# Patient Record
Sex: Male | Born: 1985 | Race: Black or African American | Hispanic: No | Marital: Married | Smoking: Current every day smoker
Health system: Southern US, Community
[De-identification: ages and names within clinical notes are randomized; demographics above are authoritative.]

---

## 2013-10-03 ENCOUNTER — Emergency Department (HOSPITAL_COMMUNITY)
Admission: EM | Admit: 2013-10-03 | Discharge: 2013-10-03 | Disposition: A | Discharge status: Home / Self Care | Payer: Self-pay | Attending: Emergency Medicine | Admitting: Emergency Medicine

## 2013-10-03 ENCOUNTER — Encounter (HOSPITAL_COMMUNITY): Payer: Self-pay | Admitting: Emergency Medicine

## 2013-10-03 DIAGNOSIS — F17200 Nicotine dependence, unspecified, uncomplicated: Secondary | ICD-10-CM | POA: Insufficient documentation

## 2013-10-03 DIAGNOSIS — J020 Streptococcal pharyngitis: Secondary | ICD-10-CM | POA: Insufficient documentation

## 2013-10-03 DIAGNOSIS — R000 Tachycardia, unspecified: Secondary | ICD-10-CM | POA: Insufficient documentation

## 2013-10-03 MED ORDER — TRAMADOL HCL 50 MG PO TABS: 50.0000 mg | ORAL_TABLET | Freq: Four times a day (QID) | ORAL | Status: DC | PRN

## 2013-10-03 MED ORDER — AMOXICILLIN 500 MG PO CAPS: 500.0000 mg | ORAL_CAPSULE | Freq: Three times a day (TID) | ORAL | Status: DC

## 2013-10-03 NOTE — ED Provider Notes (Signed)
CSN: [id number]     Arrival date & time 10/03/13  1132 History  This chart was scribed for non-physician practitioner, Emilia BeckKaitlyn Szekalski, PA-C, working with Ethelda ChickMartha K Linker, MD, by Bronson CurbJacqueline Melvin, ED Scribe. This patient was seen in room TR11C/TR11C and the patient's care was started at 12:25 PM.    Chief Complaint  Patient presents with  . Sore Throat    Patient is a 28 y.o. male presenting with pharyngitis. The history is provided by the patient. No language interpreter was used.  Sore Throat This is a new problem. The current episode started 2 days ago. The problem has not changed since onset.Pertinent negatives include no chest pain, no abdominal pain, no headaches and no shortness of breath. Nothing aggravates the symptoms. Nothing relieves the symptoms. He has tried nothing for the symptoms.    HPI Comments: Aaron Mcbride is a 28 y.o. male with no history of significant health conditions, who presents to the Emergency Department complaining of sore throat onset 2 day ago. Patient reports pain when swallowing and believes his "glands are swollen" There is associated cough, low grade fever (traige temp 99.3 F), decreased appetite, myalgias, and malaise. He has not taken anything to relieve his symptoms and denies any modifying factors. Patient reports history of similar symptoms.    History reviewed. No pertinent past medical history. History reviewed. No pertinent past surgical history. No family history on file. History  Substance Use Topics  . Smoking status: Current Every Day Smoker  . Smokeless tobacco: Not on file  . Alcohol Use: Yes    Review of Systems  Constitutional: Positive for activity change and fatigue.  HENT: Positive for sore throat.   Respiratory: Positive for cough. Negative for shortness of breath.   Cardiovascular: Negative for chest pain.  Gastrointestinal: Negative for abdominal pain.  Musculoskeletal: Positive for myalgias.  Neurological: Negative for  headaches.  All other systems reviewed and are negative.     Allergies  Review of patient's allergies indicates not on file.  Home Medications   Prior to Admission medications   Not on File   Triage Vitals: BP 125/72  Pulse 108  Temp(Src) 99.3 F (37.4 C) (Oral)  Resp 17  Ht 5\' 7"  (1.702 m)  Wt 225 lb (102.059 kg)  BMI 35.23 kg/m2  SpO2 96%  Physical Exam  Nursing note and vitals reviewed. Constitutional: He is oriented to person, place, and time. He appears well-developed and well-nourished. No distress.  HENT:  Head: Normocephalic and atraumatic.  Mouth/Throat: Oropharyngeal exudate present.  Bilateral tonsillar edema, erythema and exudate.   Eyes: Conjunctivae and EOM are normal.  Neck: Neck supple. No tracheal deviation present.  Cardiovascular: Normal rate.   Pulmonary/Chest: Effort normal. No respiratory distress.  Musculoskeletal: Normal range of motion.  Lymphadenopathy:    He has cervical adenopathy.  Neurological: He is alert and oriented to person, place, and time.  Skin: Skin is warm and dry.  Psychiatric: He has a normal mood and affect. His behavior is normal.    ED Course  Procedures (including critical care time)  DIAGNOSTIC STUDIES: Oxygen Saturation is 96% on room air, adequate by my interpretation.    COORDINATION OF CARE: At 1232 Discussed treatment plan with patient which includes Amoxil and Ultram. Patient agrees.   Labs Review Labs Reviewed - No data to display  Imaging Review No results found.   EKG Interpretation None      MDM   Final diagnoses:  Strep throat    12:57  PM Patient will be treated for strep throat based on [facility] criteria. Patient has a low grade temp and mildly tachycardic. Patient will have amoxicillin and tramadol for symptoms. Patient instructed to return with worsening or concerning symptoms.   I personally performed the services described in this documentation, which was scribed in my presence. The  recorded information has been reviewed and is accurate.    Emilia BeckKaitlyn Szekalski, PA-C 10/03/13 1258

## 2013-10-03 NOTE — ED Notes (Signed)
Pt. Stated, My glands are swollen for 2 days.

## 2013-10-03 NOTE — ED Provider Notes (Signed)
Medical screening examination/treatment/procedure(s) were performed by non-physician practitioner and as supervising physician I was immediately available for consultation/collaboration.   EKG Interpretation None       [name], MD 10/03/13 1501 

## 2013-10-03 NOTE — Discharge Instructions (Signed)
Take Amoxicillin as directed until gone. Take Tramadol as needed for pain. Refer to attache documents for more information. Return to the ED with worsening or concerning symptoms.

## 2013-10-16 ENCOUNTER — Emergency Department (HOSPITAL_COMMUNITY)
Admission: EM | Admit: 2013-10-16 | Discharge: 2013-10-16 | Disposition: A | Discharge status: Home / Self Care | Payer: Self-pay | Attending: Emergency Medicine | Admitting: Emergency Medicine

## 2013-10-16 ENCOUNTER — Encounter (HOSPITAL_COMMUNITY): Payer: Self-pay | Admitting: Emergency Medicine

## 2013-10-16 DIAGNOSIS — H9209 Otalgia, unspecified ear: Secondary | ICD-10-CM | POA: Insufficient documentation

## 2013-10-16 DIAGNOSIS — H9202 Otalgia, left ear: Secondary | ICD-10-CM

## 2013-10-16 DIAGNOSIS — J029 Acute pharyngitis, unspecified: Secondary | ICD-10-CM | POA: Insufficient documentation

## 2013-10-16 DIAGNOSIS — F17200 Nicotine dependence, unspecified, uncomplicated: Secondary | ICD-10-CM | POA: Insufficient documentation

## 2013-10-16 MED ORDER — AMOXICILLIN-POT CLAVULANATE 250-62.5 MG/5ML PO SUSR: 500.0000 mg | Freq: Two times a day (BID) | ORAL | Status: AC

## 2013-10-16 MED ORDER — IBUPROFEN 800 MG PO TABS: 800.0000 mg | ORAL_TABLET | Freq: Three times a day (TID) | ORAL | Status: AC

## 2013-10-16 NOTE — ED Notes (Signed)
Pt was treated 2 weeks ago for strep throat . Pt reports he finished antibx. And pain meds. Pt reports no improvement after completion of  meds.

## 2013-10-16 NOTE — ED Notes (Signed)
He states his L ear aches and "my glands feel swollen on the left side of my neck."

## 2013-10-16 NOTE — ED Provider Notes (Signed)
CSN: [id number]     Arrival date & time 10/16/13  1429 History   First MD Initiated Contact with Patient 10/16/13 1555     Chief Complaint  Patient presents with  . Otalgia     (Consider location/radiation/quality/duration/timing/severity/associated sxs/prior Treatment) HPI Comments: Patient presents to the ED with a chief complaint of sore throat and ear ache.  He states that his symptoms started about 2 weeks ago.  He was treated with amoxicillin and tramadol, but states that his symptoms have not improved.  He reports intermittent fevers and chills.  Denies nausea, vomiting, diarrhea, or constipation.  He also states that his glands have been swollen.  States that his throat pain makes swallowing difficult.  The history is provided by the patient. No language interpreter was used.    History reviewed. No pertinent past medical history. History reviewed. No pertinent past surgical history. History reviewed. No pertinent family history. History  Substance Use Topics  . Smoking status: Current Every Day Smoker    Types: Cigarettes  . Smokeless tobacco: Not on file  . Alcohol Use: Yes    Review of Systems  Constitutional: Positive for fever and chills.  HENT: Positive for ear pain and sore throat. Negative for drooling and postnasal drip.   Respiratory: Negative for cough and shortness of breath.   Gastrointestinal: Negative for nausea, vomiting, diarrhea and constipation.      Allergies  Review of patient's allergies indicates no known allergies.  Home Medications   Prior to Admission medications   Medication Sig Start Date End Date Taking? Authorizing Provider  amoxicillin-clavulanate (AUGMENTIN) 250-62.5 MG/5ML suspension Take 10 mLs (500 mg total) by mouth 2 (two) times daily. 10/16/13   Roxy Horsemanobert Synia Douglass, PA-C  ibuprofen (ADVIL,MOTRIN) 800 MG tablet Take 1 tablet (800 mg total) by mouth 3 (three) times daily. 10/16/13   Roxy Horsemanobert Ori Kreiter, PA-C   BP 137/73  Pulse 106   Temp(Src) 99 F (37.2 C) (Oral)  Resp 20  Ht 5\' 9"  (1.753 m)  Wt 223 lb (101.152 kg)  BMI 32.92 kg/m2  SpO2 97% Physical Exam  Nursing note and vitals reviewed. Constitutional: He is oriented to person, place, and time. He appears well-developed and well-nourished.  HENT:  Head: Normocephalic and atraumatic.  Oropharynx is erythematous, no exudates. No signs of peritonsillar or tonsillar abscess.  No signs of gingival abscess. Uvula is midline.  Airway is intact. No signs of Ludwig's angina with palpation of oral and sublingual mucosa. Tolerating secretions.  Left TM is mildly erythematous with effusion   Eyes: Conjunctivae and EOM are normal.  Neck: Normal range of motion.  Left sided anterior cervical chain adenopathy  Cardiovascular: Normal rate.   Pulmonary/Chest: Effort normal.  Abdominal: He exhibits no distension.  Musculoskeletal: Normal range of motion.  Neurological: He is alert and oriented to person, place, and time.  Skin: Skin is dry.  Psychiatric: He has a normal mood and affect. His behavior is normal. Judgment and thought content normal.    ED Course  Procedures (including critical care time) Labs Review Labs Reviewed - No data to display  Imaging Review No results found.   EKG Interpretation None      MDM   Final diagnoses:  Otalgia, left    Patient with sore throat and earache with effusion.  Was treated with amoxicillin, but no improvement.  Will bump the patient up to augmentin.  Recommend smooth diet for the next couple of days.  Recommend return for worsening symptoms, otherwise follow-up with  PCP.    Ceasar MonsFiled Vitals:   10/16/13 1617  BP: 139/83  Pulse: 92  Temp:   Resp: [address]       Larisa Lanius, PA-C 10/16/13 1647

## 2013-10-16 NOTE — ED Provider Notes (Signed)
Medical screening examination/treatment/procedure(s) were performed by non-physician practitioner and as supervising physician I was immediately available for consultation/collaboration.    Linwood DibblesJon Nyeisha Goodall, MD 10/16/13 [phone]

## 2013-10-16 NOTE — ED Notes (Signed)
Declined W/C at D/C and was escorted to lobby by RN. 

## 2013-10-16 NOTE — Discharge Instructions (Signed)
Otitis Media With Effusion °Otitis media with effusion is the presence of fluid in the middle ear. This is a common problem in children, which often follows ear infections. It may be present for weeks or longer after the infection. Unlike an acute ear infection, otitis media with effusion refers only to fluid behind the ear drum and not infection. Children with repeated ear and sinus infections and allergy problems are the most likely to get otitis media with effusion. °CAUSES  °The most frequent cause of the fluid buildup is dysfunction of the eustachian tubes. These are the tubes that drain fluid in the ears to the to the back of the nose (nasopharynx). °SYMPTOMS  °· The main symptom of this condition is hearing loss. As a result, you or your child may: °¨ Listen to the TV at a loud volume. °¨ Not respond to questions. °¨ Ask "what" often when spoken to. °¨ Mistake or confuse on sound or word for another. °· There may be a sensation of fullness or pressure but usually not pain. °DIAGNOSIS  °· Your health care provider will diagnose this condition by examining you or your child's ears. °· Your health care provider may test the pressure in you or your child's ear with a tympanometer. °· A hearing test may be conducted if the problem persists. °TREATMENT  °· Treatment depends on the duration and the effects of the effusion. °· Antibiotics, decongestants, nose drops, and cortisone-type drugs (tablets or nasal spray) may not be helpful. °· Children with persistent ear effusions may have delayed language or behavioral problems. Children at risk for developmental delays in hearing, learning, and speech may require referral to a specialist earlier than children not at risk. °· You or your child's health care provider may suggest a referral to an ear, nose, and throat surgeon for treatment. The following may help restore normal hearing: °¨ Drainage of fluid. °¨ Placement of ear tubes (tympanostomy tubes). °¨ Removal of  adenoids (adenoidectomy). °HOME CARE INSTRUCTIONS  °· Avoid second hand smoke. °· Infants who are breast fed are less likely to have this condition. °· Avoid feeding infants while laying flat. °· Avoid known environmental allergens. °· Avoid people who are sick. °SEEK MEDICAL CARE IF:  °· Hearing is not better in 3 months. °· Hearing is worse. °· Ear pain. °· Drainage from the ear. °· Dizziness. °MAKE SURE YOU:  °· Understand these instructions. °· Will watch your condition. °· Will get help right away if you are not doing well or get worse. °Document Released: 05/07/2004 Document Revised: 01/18/2013 Document Reviewed: 10/25/2012 °[employer]® Patient Information ©2015 [employer]. This information is not intended to replace advice given to you by your health care provider. Make sure you discuss any questions you have with your health care provider. ° °

## 2017-11-26 ENCOUNTER — Encounter (HOSPITAL_COMMUNITY): Payer: Self-pay | Admitting: Emergency Medicine

## 2017-11-26 ENCOUNTER — Emergency Department (HOSPITAL_COMMUNITY)
Admission: EM | Admit: 2017-11-26 | Discharge: 2017-11-26 | Disposition: A | Discharge status: Home / Self Care | Payer: Self-pay | Attending: Emergency Medicine | Admitting: Emergency Medicine

## 2017-11-26 ENCOUNTER — Other Ambulatory Visit: Payer: Self-pay

## 2017-11-26 ENCOUNTER — Emergency Department (HOSPITAL_COMMUNITY): Payer: Self-pay

## 2017-11-26 DIAGNOSIS — F17210 Nicotine dependence, cigarettes, uncomplicated: Secondary | ICD-10-CM | POA: Insufficient documentation

## 2017-11-26 DIAGNOSIS — E860 Dehydration: Secondary | ICD-10-CM | POA: Insufficient documentation

## 2017-11-26 DIAGNOSIS — R42 Dizziness and giddiness: Secondary | ICD-10-CM | POA: Insufficient documentation

## 2017-11-26 DIAGNOSIS — R531 Weakness: Secondary | ICD-10-CM | POA: Insufficient documentation

## 2017-11-26 DIAGNOSIS — R1084 Generalized abdominal pain: Secondary | ICD-10-CM | POA: Insufficient documentation

## 2017-11-26 LAB — HEPATIC FUNCTION PANEL
ALBUMIN: 3.7 g/dL (ref 3.5–5.0)
ALT: 32 U/L (ref 0–44)
AST: 27 U/L (ref 15–41)
Alkaline Phosphatase: 70 U/L (ref 38–126)
BILIRUBIN TOTAL: 1.1 mg/dL (ref 0.3–1.2)
Bilirubin, Direct: 0.2 mg/dL (ref 0.0–0.2)
Indirect Bilirubin: 0.9 mg/dL (ref 0.3–0.9)
Total Protein: 6.7 g/dL (ref 6.5–8.1)

## 2017-11-26 LAB — URINALYSIS, ROUTINE W REFLEX MICROSCOPIC
Bilirubin Urine: NEGATIVE
GLUCOSE, UA: NEGATIVE mg/dL
Hgb urine dipstick: NEGATIVE
KETONES UR: NEGATIVE mg/dL
LEUKOCYTES UA: NEGATIVE
NITRITE: NEGATIVE
PROTEIN: NEGATIVE mg/dL
Specific Gravity, Urine: >1.046 — ABNORMAL HIGH (ref 1.005–1.030)
pH: 6 (ref 5.0–8.0)

## 2017-11-26 LAB — BASIC METABOLIC PANEL
Anion gap: 9 (ref 5–15)
BUN: 10 mg/dL (ref 6–20)
CALCIUM: 8.7 mg/dL — AB (ref 8.9–10.3)
CO2: 20 mmol/L — ABNORMAL LOW (ref 22–32)
CREATININE: 0.88 mg/dL (ref 0.61–1.24)
Chloride: 108 mmol/L (ref 98–111)
Glucose, Bld: 116 mg/dL — ABNORMAL HIGH (ref 70–99)
Potassium: 3.8 mmol/L (ref 3.5–5.1)
Sodium: 137 mmol/L (ref 135–145)

## 2017-11-26 LAB — RAPID URINE DRUG SCREEN, HOSP PERFORMED
Amphetamines: NOT DETECTED
Barbiturates: NOT DETECTED
Benzodiazepines: NOT DETECTED
COCAINE: NOT DETECTED
OPIATES: NOT DETECTED
Tetrahydrocannabinol: NOT DETECTED

## 2017-11-26 LAB — CBC
HCT: 51.9 % (ref 39.0–52.0)
Hemoglobin: 17.4 g/dL — ABNORMAL HIGH (ref 13.0–17.0)
MCH: 29.9 pg (ref 26.0–34.0)
MCHC: 33.5 g/dL (ref 30.0–36.0)
MCV: 89.2 fL (ref 78.0–100.0)
PLATELETS: 235 10*3/uL (ref 150–400)
RBC: 5.82 MIL/uL — AB (ref 4.22–5.81)
RDW: 12.2 % (ref 11.5–15.5)
WBC: 7.7 10*3/uL (ref 4.0–10.5)

## 2017-11-26 LAB — CBG MONITORING, ED: GLUCOSE-CAPILLARY: 116 mg/dL — AB (ref 70–99)

## 2017-11-26 LAB — LIPASE, BLOOD: Lipase: 22 U/L (ref 11–51)

## 2017-11-26 MED ORDER — SODIUM CHLORIDE 0.9 % IV SOLN
INTRAVENOUS | Status: DC
  Administered 2017-11-26: 09:00:00 via INTRAVENOUS

## 2017-11-26 MED ORDER — IOHEXOL 300 MG/ML  SOLN
100.0000 mL | Freq: Once | INTRAMUSCULAR | Status: AC | PRN
  Administered 2017-11-26: 100 mL via INTRAVENOUS

## 2017-11-26 MED ORDER — ONDANSETRON HCL 4 MG/2ML IJ SOLN
4.0000 mg | Freq: Once | INTRAMUSCULAR | Status: AC
  Administered 2017-11-26: 4 mg via INTRAVENOUS
  Filled 2017-11-26: qty 2

## 2017-11-26 MED ORDER — MECLIZINE HCL 25 MG PO TABS
25.0000 mg | ORAL_TABLET | Freq: Once | ORAL | Status: AC
  Administered 2017-11-26: 25 mg via ORAL
  Filled 2017-11-26: qty 1

## 2017-11-26 MED ORDER — SODIUM CHLORIDE 0.9 % IV BOLUS
1000.0000 mL | Freq: Once | INTRAVENOUS | Status: AC
  Administered 2017-11-26: 1000 mL via INTRAVENOUS

## 2017-11-26 MED ORDER — MECLIZINE HCL 25 MG PO TABS: 25.0000 mg | ORAL_TABLET | Freq: Three times a day (TID) | ORAL | 0 refills | Status: AC | PRN

## 2017-11-26 NOTE — ED Provider Notes (Addendum)
[facility] Provider [facility]   CSN: [id number] Arrival date & time: 11/26/17  0435     History   Chief Complaint Chief Complaint  Patient presents with  . Weakness    HPI Aaron Mcbride is a 32 y.o. male.  Patient works as a Technical brewerhighway min.  Works at night.  Patient presents with the feeling of dizziness some room spinning some nominal pain mostly upper quadrants feeling weak and fatigued.  No history of recent upper respiratory infection.  Some nausea but no vomiting.  No specific neuro focal deficits no significant headache or fever neck pain.  No unusual rash.  Patient thinks that he may be dehydrated.     History reviewed. No pertinent past medical history.  There are no active problems to display for this patient.   History reviewed. No pertinent surgical history.      Home Medications    Prior to Admission medications   Medication Sig Start Date End Date Taking? Authorizing Provider  amoxicillin-clavulanate (AUGMENTIN) 250-62.5 MG/5ML suspension Take 10 mLs (500 mg total) by mouth 2 (two) times daily. Patient not taking: Reported on 11/26/2017 10/16/13   Roxy HorsemanBrowning, Robert, PA-C  ibuprofen (ADVIL,MOTRIN) 800 MG tablet Take 1 tablet (800 mg total) by mouth 3 (three) times daily. Patient not taking: Reported on 11/26/2017 10/16/13   Roxy HorsemanBrowning, Robert, PA-C  meclizine (ANTIVERT) 25 MG tablet Take 1 tablet (25 mg total) by mouth 3 (three) times daily as needed for dizziness. 11/26/17   Vanetta MuldersZackowski, Yarel Kilcrease, MD    Family History No family history on file.  Social History Social History   Tobacco Use  . Smoking status: Current Every Day Smoker    Packs/day: 1.00    Types: Cigarettes  . Smokeless tobacco: Never Used  Substance Use Topics  . Alcohol use: Yes  . Drug use: No     Allergies   Patient has no known allergies.   Review of Systems Review of Systems  Constitutional: Negative for fever.  HENT: Negative for congestion.     Eyes: Positive for photophobia. Negative for visual disturbance.  Respiratory: Negative for shortness of breath.   Cardiovascular: Negative for chest pain.  Gastrointestinal: Positive for abdominal pain and nausea. Negative for vomiting.  Genitourinary: Negative for dysuria.  Musculoskeletal: Negative for back pain.  Skin: Negative for rash.  Neurological: Positive for dizziness and weakness. Negative for seizures, syncope and headaches.  Hematological: Does not bruise/bleed easily.  Psychiatric/Behavioral: Negative for confusion.     Physical Exam Updated Vital Signs BP 117/73   Pulse 70   Temp 98.2 F (36.8 C) (Oral)   Resp 15   SpO2 98%   Physical Exam  Constitutional: He is oriented to person, place, and time. He appears well-developed and well-nourished. No distress.  HENT:  Head: Normocephalic and atraumatic.  Mucous membranes dry.  Eyes: Pupils are equal, round, and reactive to light. Conjunctivae and EOM are normal.  Cardiovascular: Normal rate, regular rhythm and normal heart sounds.  Pulmonary/Chest: Effort normal and breath sounds normal. No respiratory distress.  Abdominal: Soft. Bowel sounds are normal. There is tenderness.  Musculoskeletal: Normal range of motion. He exhibits no tenderness.  Neurological: He is alert and oriented to person, place, and time. No cranial nerve deficit or sensory deficit. He exhibits normal muscle tone. Coordination normal.  Skin: Skin is warm. No rash noted.  Nursing note and vitals reviewed.    ED Treatments / Results  Labs (all labs ordered are listed, but  only abnormal results are displayed) Labs Reviewed  BASIC METABOLIC PANEL - Abnormal; Notable for the following components:      Result Value   CO2 20 (*)    Glucose, Bld 116 (*)    Calcium 8.7 (*)    All other components within normal limits  CBC - Abnormal; Notable for the following components:   RBC 5.82 (*)    Hemoglobin 17.4 (*)    All other components within  normal limits  URINALYSIS, ROUTINE W REFLEX MICROSCOPIC - Abnormal; Notable for the following components:   Specific Gravity, Urine >1.046 (*)    All other components within normal limits  CBG MONITORING, ED - Abnormal; Notable for the following components:   Glucose-Capillary 116 (*)    All other components within normal limits  HEPATIC FUNCTION PANEL  LIPASE, BLOOD  RAPID URINE DRUG SCREEN, HOSP PERFORMED    EKG EKG Interpretation  Date/Time:  Friday November 26 2017 04:52:01 EDT Ventricular Rate:  88 PR Interval:  134 QRS Duration: 94 QT Interval:  384 QTC Calculation: 464 R Axis:   57 Text Interpretation:  Normal sinus rhythm Normal ECG Confirmed by Vanetta Mulders [phone]) on 11/26/2017 7:24:32 AM   Radiology Ct Head Wo Contrast  Result Date: 11/26/2017 CLINICAL DATA:  Dizziness.  Ataxia. EXAM: CT HEAD WITHOUT CONTRAST TECHNIQUE: Contiguous axial images were obtained from the base of the skull through the vertex without intravenous contrast. COMPARISON:  None. FINDINGS: Brain: No mass lesion, hemorrhage, hydrocephalus, acute infarct, intra-axial, or extra-axial fluid collection. Vascular: No hyperdense vessel or unexpected calcification. Skull: No significant soft tissue swelling.  Normal skull. Sinuses/Orbits: Probable lymph node within the right parotid gland at 10 mm on image 1/4. Normal imaged portions of the orbits and globes. Clear paranasal sinuses and mastoid air cells. Other: None. IMPRESSION: No acute intracranial abnormality. Electronically Signed   By: Jeronimo Greaves M.D.   On: 11/26/2017 10:40   Ct Abdomen Pelvis W Contrast  Result Date: 11/26/2017 CLINICAL DATA:  Abdominal pain and weakness EXAM: CT ABDOMEN AND PELVIS WITH CONTRAST TECHNIQUE: Multidetector CT imaging of the abdomen and pelvis was performed using the standard protocol following bolus administration of intravenous contrast. CONTRAST:  OMNIPAQUE IOHEXOL 300 MG/ML  SOLN COMPARISON:  None. FINDINGS:  Lower chest:  No contributory findings. Hepatobiliary: Low-density liver with sparing at the gallbladder fossa. Negative for mass.No evidence of biliary obstruction or stone. Pancreas: Unremarkable. Spleen: Unremarkable. Adrenals/Urinary Tract: Negative adrenals. No hydronephrosis or stone. Early excretion of contrast seen in the Upper collecting system and to a small degree in the urinary bladder. Unremarkable bladder. Stomach/Bowel:  No obstruction. No appendicitis. Vascular/Lymphatic: No acute vascular abnormality. Prominent lymph nodes along the greater curvature of the stomach but the stomach itself is unremarkable. Reproductive:No pathologic findings. Other: No ascites or pneumoperitoneum. Musculoskeletal: No acute abnormalities. IMPRESSION: No acute finding. Hepatic steatosis. Electronically Signed   By: Marnee Spring M.D.   On: 11/26/2017 11:12    Procedures Procedures (including critical care time)  Medications Ordered in ED Medications  0.9 %  sodium chloride infusion ( Intravenous New Bag/Given 11/26/17 0846)  sodium chloride 0.9 % bolus 1,000 mL (0 mLs Intravenous Stopped 11/26/17 1018)  meclizine (ANTIVERT) tablet 25 mg (25 mg Oral Given 11/26/17 0846)  ondansetron (ZOFRAN) injection 4 mg (4 mg Intravenous Given 11/26/17 0846)  iohexol (OMNIPAQUE) 300 MG/ML solution 100 mL (100 mLs Intravenous Contrast Given 11/26/17 1034)     Initial Impression / Assessment and Plan / ED Course  I have reviewed the triage vital signs and the nursing notes.  Pertinent labs & imaging results that were available during my care of the patient were reviewed by me and considered in my medical decision making (see chart for details).    Patient with symptoms of dizziness may be some slight vertigo.  But improved here with meclizine.  Patient also with complaint of upper abdominal pain.  CT abdomen negative.  Lab work-up without any significant abnormalities.  Patient symptoms overall improved.  Patient  works as a Education administratorhighwayman.   we will take him out of work until Monday.  Referral to neurology if dizziness persists.  He will return for any new or worse symptoms.  Urinalysis was very concentrated.  Symptoms could be secondary to dehydration.  Urine drug screen still pending.   Final Clinical Impressions(s) / ED Diagnoses   Final diagnoses:  Weakness  Dizziness  Generalized abdominal pain  Dehydration    ED Discharge Orders         Ordered    meclizine (ANTIVERT) 25 MG tablet  3 times daily PRN     11/26/17 1230           Vanetta MuldersZackowski, Karista Aispuro, MD 11/26/17 1246   Urine drug screen negative.   Vanetta MuldersZackowski, Jonathyn Carothers, MD 11/26/17 1256

## 2017-11-26 NOTE — ED Notes (Signed)
Pt still not able to give urine sample, explained to patient importance of urine sample; pt states " well let me see if I can try in a while "

## 2017-11-26 NOTE — ED Notes (Addendum)
Pt made aware that we need a urine sample but unable to go at this time.

## 2017-11-26 NOTE — ED Triage Notes (Signed)
Pt states that since yesterday has had a feeling of weakness and felt dizzy. Got to work and had to leave early. Nausea but no vomiting. No appetite. Pt states he feels unsteady on his feet. No LOC.

## 2017-11-26 NOTE — ED Notes (Signed)
Pt was able to ambulate to bathroom with strong and steady gait ; pt states " I feel a lot better at this time " ; pt eating at this time

## 2017-11-26 NOTE — Discharge Instructions (Addendum)
Make an appointment to follow-up with neurology.  Take the meclizine/Antivert as needed for the dizziness.  Work note provided.  At return for any new or worse symptoms.  If dizziness persist follow-up with neurology.  Part of the issue seems to be dehydration.  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

## 2017-11-26 NOTE — ED Notes (Signed)
cbg 116 

## 2017-11-26 NOTE — ED Notes (Signed)
Pt still not able to give urine sample at this time. 

## 2017-11-29 ENCOUNTER — Emergency Department (HOSPITAL_COMMUNITY)
Admission: EM | Admit: 2017-11-29 | Discharge: 2017-11-30 | Disposition: A | Discharge status: Home / Self Care | Payer: Self-pay | Attending: Emergency Medicine | Admitting: Emergency Medicine

## 2017-11-29 ENCOUNTER — Encounter (HOSPITAL_COMMUNITY): Payer: Self-pay | Admitting: Emergency Medicine

## 2017-11-29 DIAGNOSIS — R531 Weakness: Secondary | ICD-10-CM | POA: Insufficient documentation

## 2017-11-29 DIAGNOSIS — F17210 Nicotine dependence, cigarettes, uncomplicated: Secondary | ICD-10-CM | POA: Insufficient documentation

## 2017-11-29 DIAGNOSIS — Z79899 Other long term (current) drug therapy: Secondary | ICD-10-CM | POA: Insufficient documentation

## 2017-11-29 DIAGNOSIS — E860 Dehydration: Secondary | ICD-10-CM | POA: Insufficient documentation

## 2017-11-29 LAB — CBC
HCT: 57.2 % — ABNORMAL HIGH (ref 39.0–52.0)
HEMOGLOBIN: 19.5 g/dL — AB (ref 13.0–17.0)
MCH: 30.1 pg (ref 26.0–34.0)
MCHC: 34.1 g/dL (ref 30.0–36.0)
MCV: 88.3 fL (ref 78.0–100.0)
Platelets: 257 10*3/uL (ref 150–400)
RBC: 6.48 MIL/uL — ABNORMAL HIGH (ref 4.22–5.81)
RDW: 12.2 % (ref 11.5–15.5)
WBC: 8.3 10*3/uL (ref 4.0–10.5)

## 2017-11-29 LAB — BASIC METABOLIC PANEL
ANION GAP: 10 (ref 5–15)
BUN: 16 mg/dL (ref 6–20)
CHLORIDE: 107 mmol/L (ref 98–111)
CO2: 21 mmol/L — ABNORMAL LOW (ref 22–32)
CREATININE: 0.97 mg/dL (ref 0.61–1.24)
Calcium: 10 mg/dL (ref 8.9–10.3)
GFR calc non Af Amer: >60 mL/min (ref >60)
Glucose, Bld: 92 mg/dL (ref 70–99)
Potassium: 4.2 mmol/L (ref 3.5–5.1)
SODIUM: 138 mmol/L (ref 135–145)

## 2017-11-29 MED ORDER — SODIUM CHLORIDE 0.9 % IV BOLUS
1000.0000 mL | Freq: Once | INTRAVENOUS | Status: AC
  Administered 2017-11-30: 1000 mL via INTRAVENOUS

## 2017-11-29 NOTE — ED Provider Notes (Signed)
[facility] EMERGENCY DEPARTMENT Provider Note   CSN: [id number] Arrival date & time: 11/29/17  2016     History   Chief Complaint Chief Complaint  Patient presents with  . Weakness    HPI Aaron Mcbride is a 32 y.o. male.  The history is provided by the patient and medical records.  Weakness     32 year old male presenting to the ED for generalized weakness and feelings of near syncope.  He was seen in the ED on Thursday, 4 days ago for same.  Was told he was dehydrated.  He had full work-up including CT of his head, labs, and CT abdomen pelvis.  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  Reports since being seen in the ED he has been trying to eat regularly and drink lots of fluids, however eating is a regular due to working night shift.  Reports drinking up to 8 bottles of water daily.  He does report some loose stools but no frank diarrhea.  No melena or hematochezia.  No nausea or vomiting.  States today at work they were in a meeting and he stood up and felt very lightheaded, almost like he was going to pass out.  He sat back down, drank some water and it slowly improved.  States he was prescribed some meclizine from ED visit prior which he has been taking without change.  He denies any headache, confusion, numbness, weakness, generalized joint pain, body aches.  No chest pain or shortness of breath.  History reviewed. No pertinent past medical history.  There are no active problems to display for this patient.   History reviewed. No pertinent surgical history.      Home Medications    Prior to Admission medications   Medication Sig Start Date End Date Taking? Authorizing Provider  amoxicillin-clavulanate (AUGMENTIN) 250-62.5 MG/5ML suspension Take 10 mLs (500 mg total) by mouth 2 (two) times daily. Patient not taking: Reported on 11/26/2017 10/16/13   Roxy Horseman, PA-C  ibuprofen (ADVIL,MOTRIN) 800 MG tablet Take 1 tablet (800 mg total)  by mouth 3 (three) times daily. Patient not taking: Reported on 11/26/2017 10/16/13   Roxy Horseman, PA-C  meclizine (ANTIVERT) 25 MG tablet Take 1 tablet (25 mg total) by mouth 3 (three) times daily as needed for dizziness. 11/26/17   Vanetta Mulders, MD    Family History No family history on file.  Social History Social History   Tobacco Use  . Smoking status: Current Every Day Smoker    Packs/day: 1.00    Types: Cigarettes  . Smokeless tobacco: Never Used  Substance Use Topics  . Alcohol use: Yes  . Drug use: No     Allergies   Patient has no known allergies.   Review of Systems Review of Systems  Neurological: Positive for weakness.  All other systems reviewed and are negative.    Physical Exam Updated Vital Signs BP 107/88 (BP Location: Right Arm)   Pulse 99   Temp 97.9 F (36.6 C) (Oral)   Resp 16   SpO2 100%   Physical Exam  Constitutional: He is oriented to person, place, and time. He appears well-developed and well-nourished. No distress.  HENT:  Head: Normocephalic and atraumatic.  Right Ear: External ear normal.  Left Ear: External ear normal.  Mouth/Throat: Oropharynx is clear and moist.  Eyes: Pupils are equal, round, and reactive to light. Conjunctivae and EOM are normal.  Neck: Normal range of motion and full passive range  of motion without pain. Neck supple. No neck rigidity.  No rigidity, no meningismus  Cardiovascular: Normal rate, regular rhythm and normal heart sounds.  No murmur heard. Pulmonary/Chest: Effort normal and breath sounds normal. No stridor. No respiratory distress. He has no wheezes. He has no rhonchi.  Abdominal: Soft. Bowel sounds are normal. There is no tenderness. There is no rebound and no guarding.  Musculoskeletal: Normal range of motion. He exhibits no edema.  Neurological: He is alert and oriented to person, place, and time. He has normal strength. He displays no tremor. No cranial nerve deficit or sensory deficit. He  displays no seizure activity.  AAOx3, answering questions and following commands appropriately; equal strength UE and LE bilaterally; CN grossly intact; moves all extremities appropriately without ataxia; no focal neuro deficits or facial asymmetry appreciated  Skin: Skin is warm and dry. No rash noted. He is not diaphoretic.  Psychiatric: He has a normal mood and affect. His behavior is normal. Thought content normal.  Nursing note and vitals reviewed.    ED Treatments / Results  Labs (all labs ordered are listed, but only abnormal results are displayed) Labs Reviewed  BASIC METABOLIC PANEL - Abnormal; Notable for the following components:      Result Value   CO2 21 (*)    All other components within normal limits  CBC - Abnormal; Notable for the following components:   RBC 6.48 (*)    Hemoglobin 19.5 (*)    HCT 57.2 (*)    All other components within normal limits  URINALYSIS, ROUTINE W REFLEX MICROSCOPIC    EKG None  Radiology No results found.  Procedures Procedures (including critical care time)  Medications Ordered in ED Medications  sodium chloride 0.9 % bolus 1,000 mL (has no administration in time range)    Followed by  sodium chloride 0.9 % bolus 1,000 mL (has no administration in time range)     Initial Impression / Assessment and Plan / ED Course  I have reviewed the triage vital signs and the nursing notes.  Pertinent labs & imaging results that were available during my care of the patient were reviewed by me and considered in my medical decision making (see chart for details).  32 year old male presenting to the ED with generalized weakness and near syncopal episode earlier today at work.  States he stood up after a meeting and felt very lightheaded and diaphoretic.  This improved with sitting, resting, and drinking water.  Seen here 4 days ago for similar, was told he was dehydrated but was prescribed some meclizine.  States no change with medications.   Here he is afebrile, non-toxic.  No focal deficits.  No chest pain or SOB.  Some loose stools but abdomen soft, benign.  Labs with worsening hemoconcentration consistent with dehydration.  Patient works night shift outside, possible component of some heat exposure as well.  Denies any generalized body aches or joint pains.  No blood in the urine.  Do not suspect acute rhabdomyolysis.  Will be given boluses of fluid.  Will reassess.  After first liter patient reports he is feeling somewhat better.  Has urinated already.  Patient also ordered himself pizza.  VSS.  Will discharge home once IV fluids finished.  Suspect his symptoms are related to dehydration.  Does not have any vertiginous symptoms here today.  Neurologic exam remains nonfocal.  We will have him continue good oral hydration, can try to supplement with Gatorade/Powerade for additional electrolytes.  We will have  him follow-up closely with primary care doctor.  He will return here for any new or worsening symptoms.  Final Clinical Impressions(s) / ED Diagnoses   Final diagnoses:  Weakness  Dehydration    ED Discharge Orders    None       Garlon HatchetSanders, Avin Gibbons M, PA-C 11/30/17 0254    Gilda CreasePollina, Christopher J, MD 11/30/17 [phone]

## 2017-11-29 NOTE — ED Notes (Signed)
Called x1, no answer

## 2017-11-29 NOTE — ED Notes (Signed)
Pt back in waiting room 

## 2017-11-29 NOTE — ED Triage Notes (Signed)
Pt returns to the ED tonight for increasing weakness and sudden diaphoresis. Pt was seen here on Saturday night for same. Has been taking meclizine and zofran as directed and states he has been drinking and eating.

## 2017-11-30 LAB — URINALYSIS, ROUTINE W REFLEX MICROSCOPIC
Bilirubin Urine: NEGATIVE
Glucose, UA: NEGATIVE mg/dL
HGB URINE DIPSTICK: NEGATIVE
KETONES UR: NEGATIVE mg/dL
LEUKOCYTES UA: NEGATIVE
Nitrite: NEGATIVE
PROTEIN: NEGATIVE mg/dL
Specific Gravity, Urine: 1.026 (ref 1.005–1.030)
pH: 5 (ref 5.0–8.0)

## 2017-11-30 NOTE — Discharge Instructions (Signed)
Continue drinking lots of fluids at home-- at least 6-8 glasses of water daily.  can try supplementing with some gatorade/powerade for added electrolytes to see if this helps. Follow-up closely with PCP. Return here for any new/acute changes.

## 2017-11-30 NOTE — ED Notes (Signed)
Pt has pizza delivery to room

## 2020-03-22 IMAGING — CT CT HEAD W/O CM
3 series · 16 of 47 positions shown, 19 images · non-contrast
Comparison: None.

CLINICAL DATA: Dizziness.  Ataxia.

EXAM:
CT HEAD WITHOUT CONTRAST
TECHNIQUE: Contiguous axial images were obtained from the base of the skull
through the vertex without intravenous contrast.

[Series 3: head 5.0 h30s · axial · 0.46mm/px · z∈[-151,-6]mm · 10 of 35 slices shown, 13 images]
[im 3/35  brain]
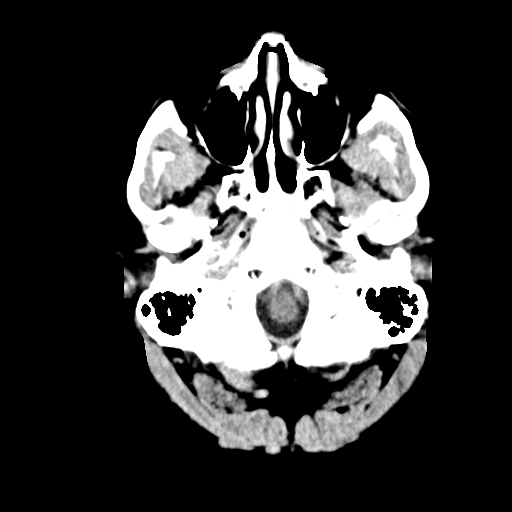
[im 3/35  bone]
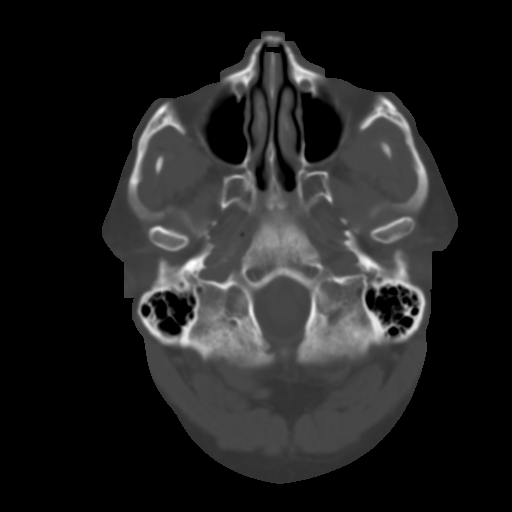
[im 6/35  brain]
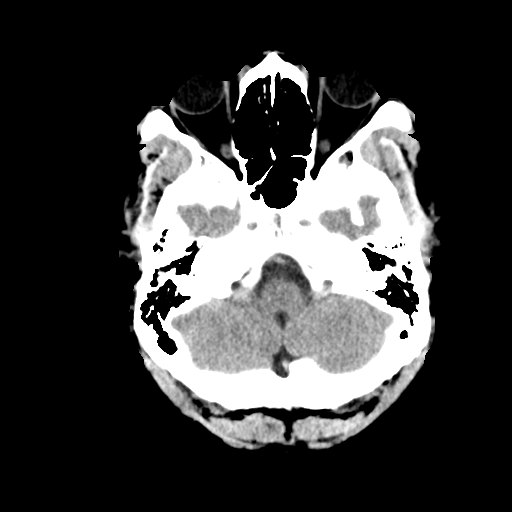
[im 10/35  brain]
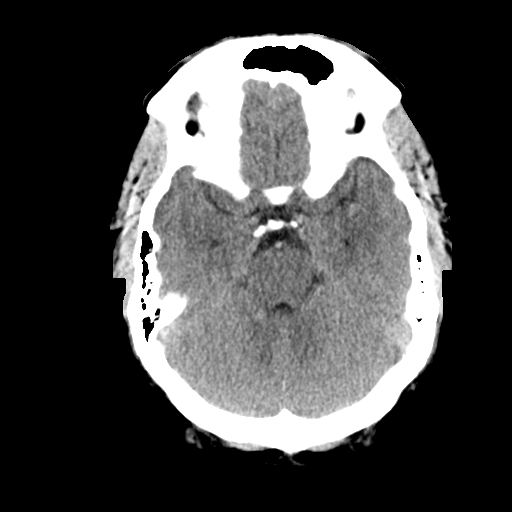
[im 12/35  brain]
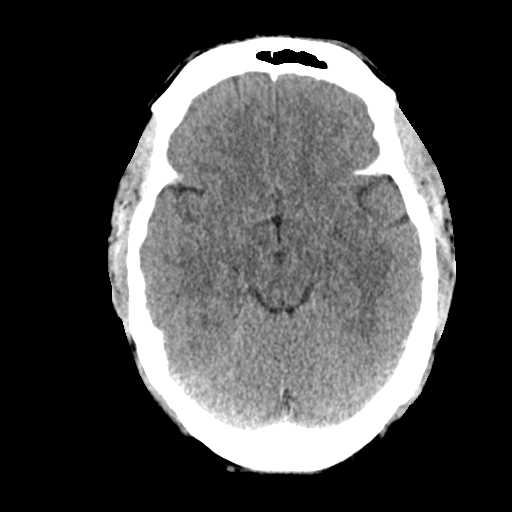
[im 16/35  brain]
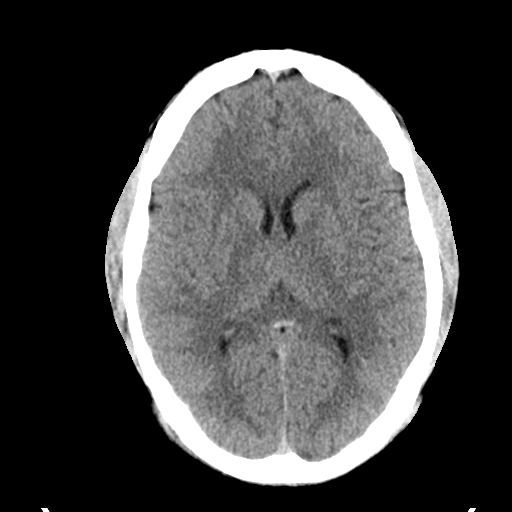
[im 16/35  bone]
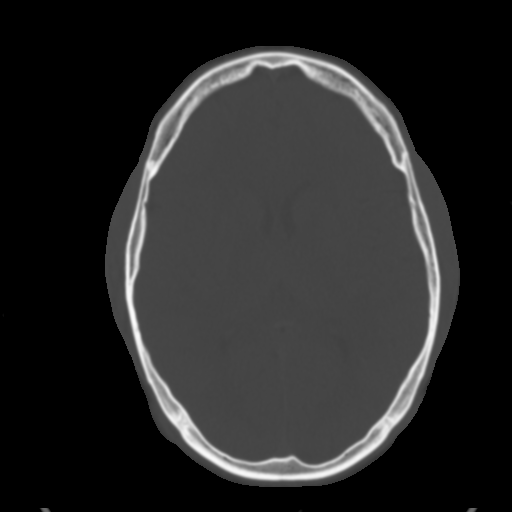
[im 19/35  brain]
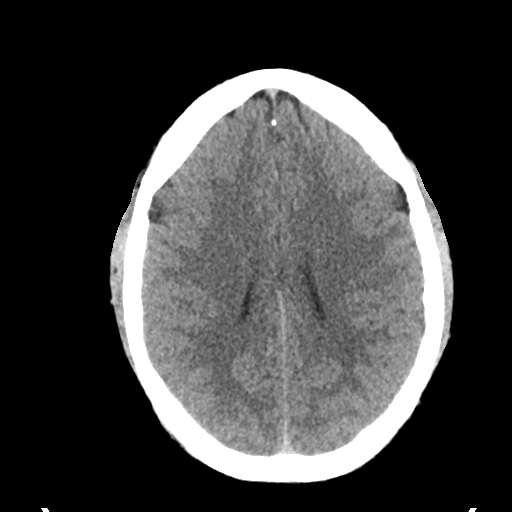
[im 23/35  brain]
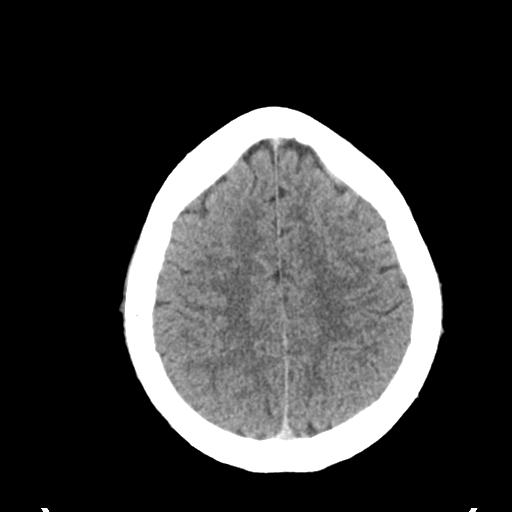
[im 26/35  brain]
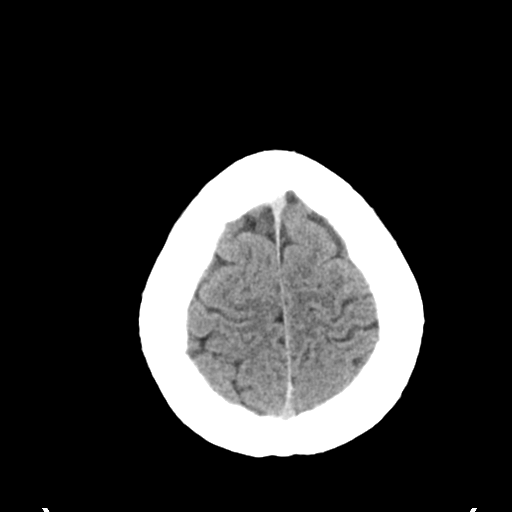
[im 29/35  brain]
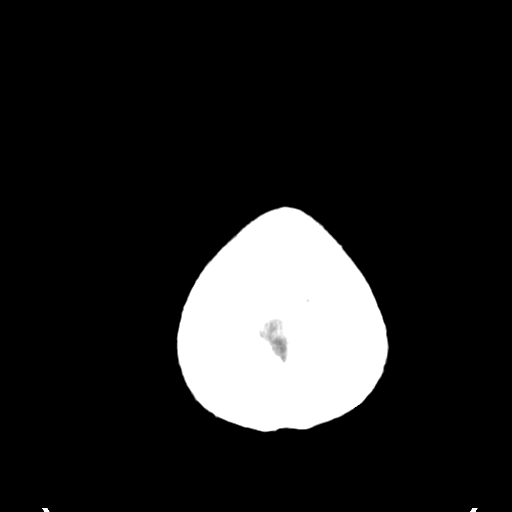
[im 29/35  bone]
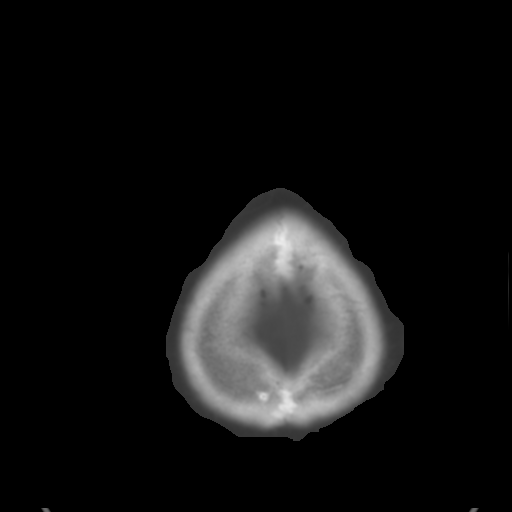
[im 32/35  brain]
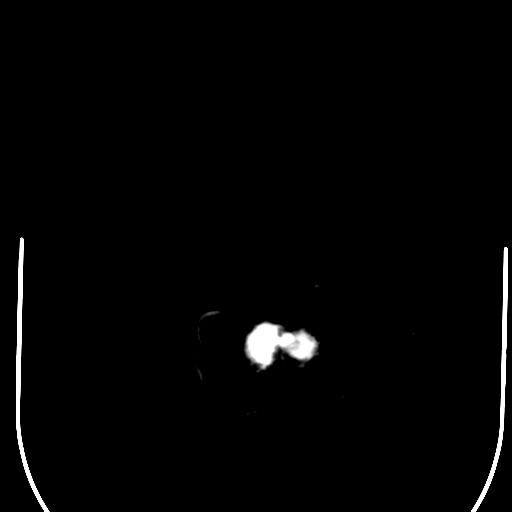

[Series 5: head 3.0 mpr cor · coronal · 0.34mm/px · 3 of 71 slices shown]
[im 24/71  brain]
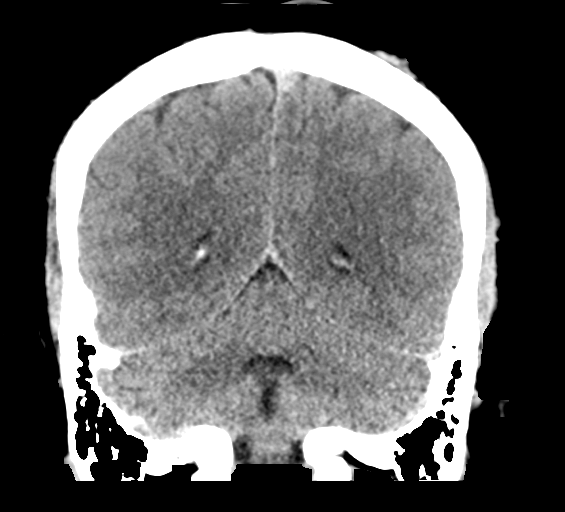
[im 32/71  brain]
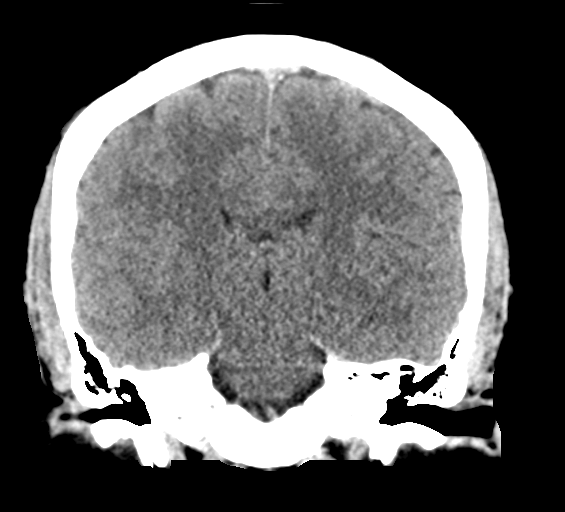
[im 39/71  brain]
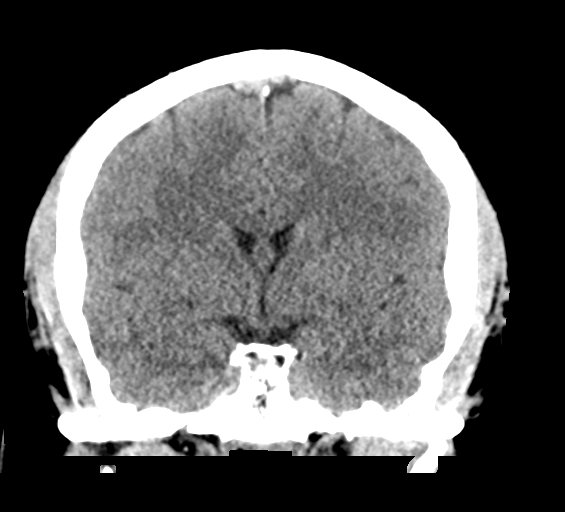

[Series 6: head 3.0 mpr sag · sagittal · 0.34mm/px · 3 of 67 slices shown]
[im 23/67  brain]
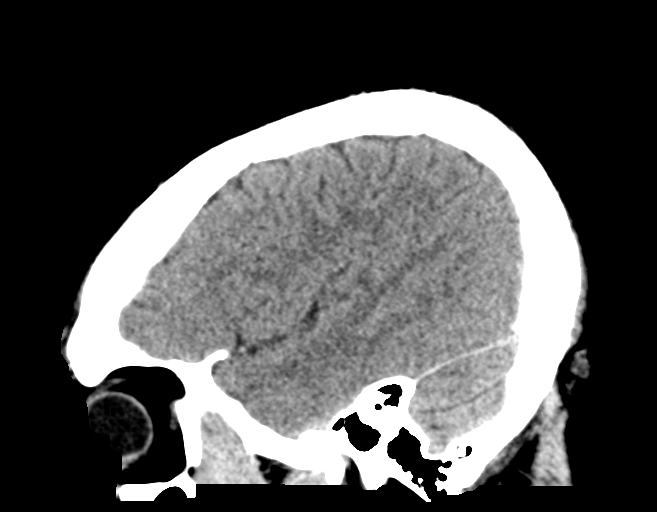
[im 34/67  brain]
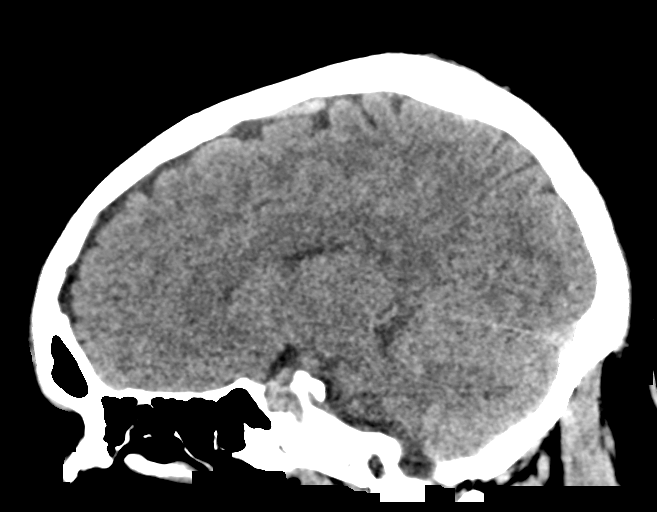
[im 45/67  brain]
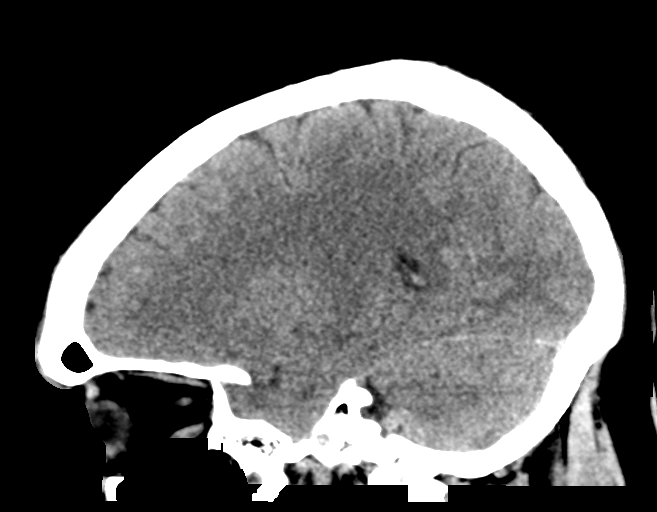

[16 of 47 positions shown; findings below may reference images not displayed]

FINDINGS: Brain: No mass lesion, hemorrhage, hydrocephalus, acute infarct,
intra-axial, or extra-axial fluid collection.

Vascular: No hyperdense vessel or unexpected calcification.

Skull: No significant soft tissue swelling.  Normal skull.

Sinuses/Orbits: Probable lymph node within the right parotid gland
at 10 mm on image [DATE]. Normal imaged portions of the orbits and
globes. Clear paranasal sinuses and mastoid air cells.

Other: None.
IMPRESSION: No acute intracranial abnormality.

## 2020-03-22 IMAGING — CT CT ABD-PELV W/ CM
2 of 4 series · 17 of 46 positions shown, 19 images · IV contrast (APPLIED)
Comparison: None.

CLINICAL DATA: Abdominal pain and weakness

EXAM:
CT ABDOMEN AND PELVIS WITH CONTRAST
TECHNIQUE: Multidetector CT imaging of the abdomen and pelvis was performed
using the standard protocol following bolus administration of
intravenous contrast.
CONTRAST:  100mL OMNIPAQUE IOHEXOL 300 MG/ML  SOLN

[Series 3: abd/ pelvis 5.0 i30f 2 · axial · 0.78mm/px · z∈[-1154,-734]mm · 14 of 92 slices shown, 16 images]
[im 4/92  soft-tissue]
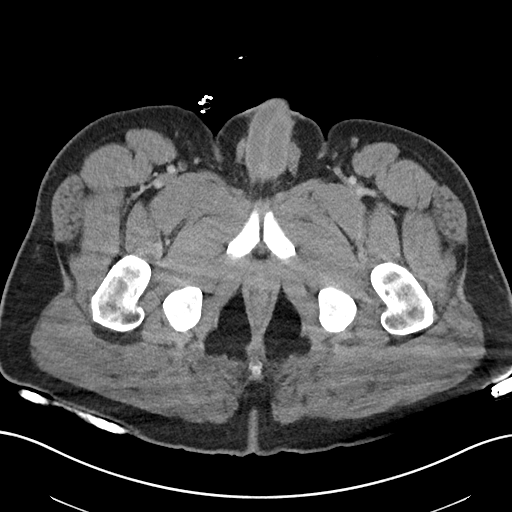
[im 4/92  bone]
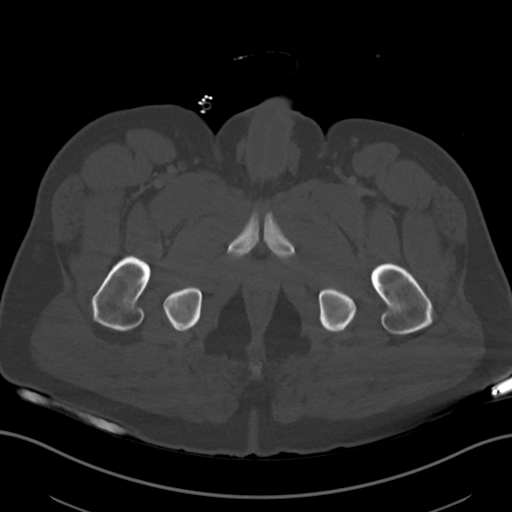
[im 11/92  soft-tissue]
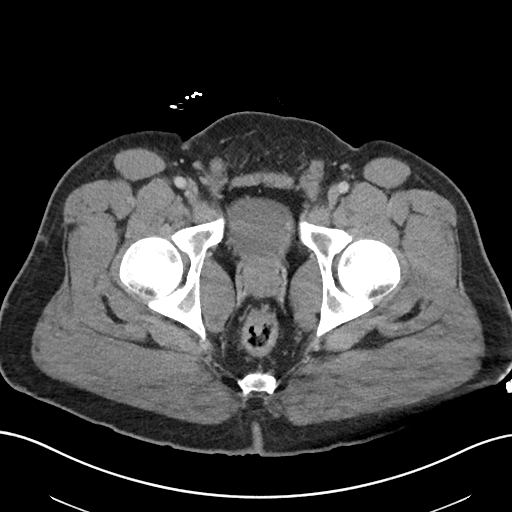
[im 18/92  soft-tissue]
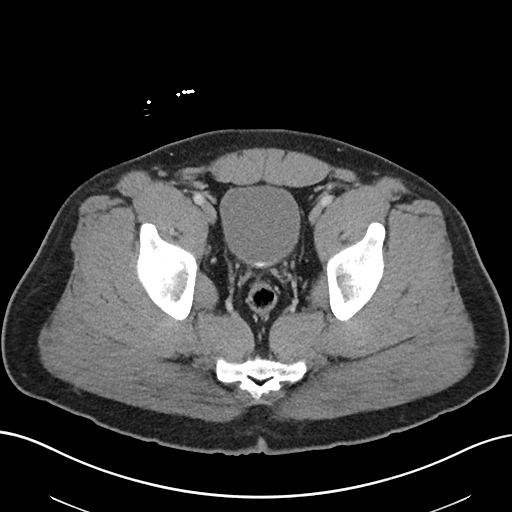
[im 25/92  soft-tissue]
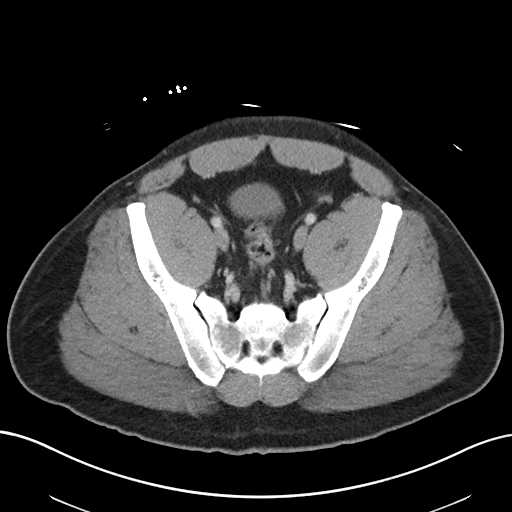
[im 32/92  soft-tissue]
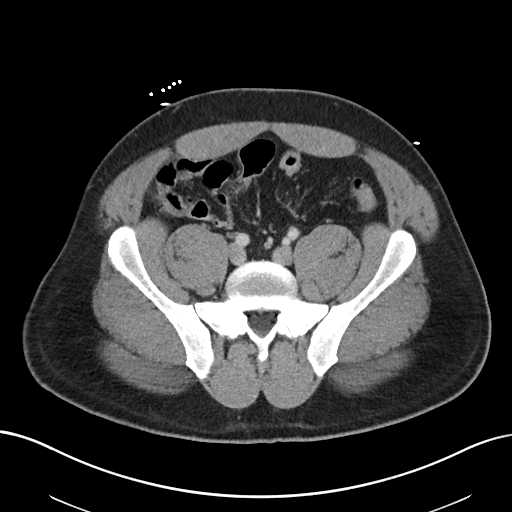
[im 36/92  soft-tissue]
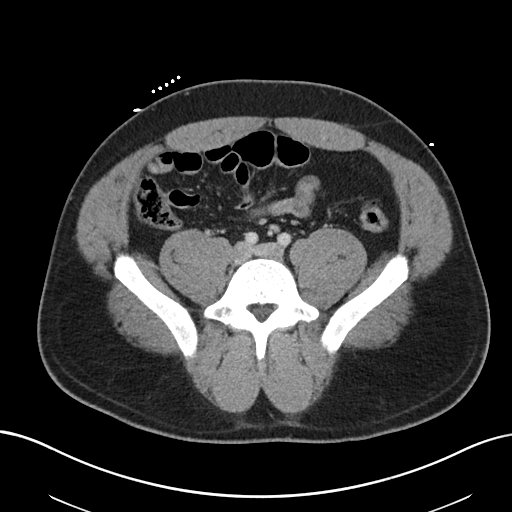
[im 43/92  soft-tissue]
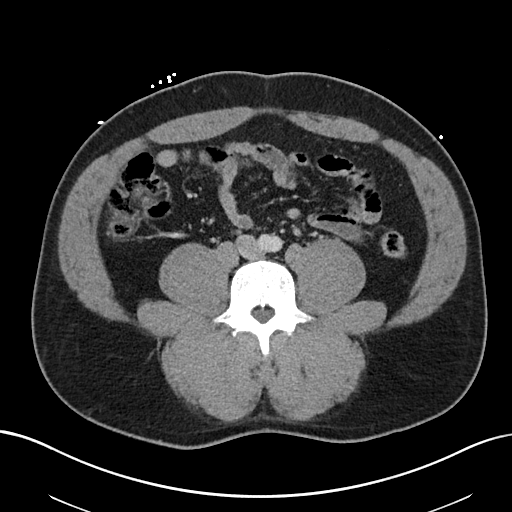
[im 50/92  soft-tissue]
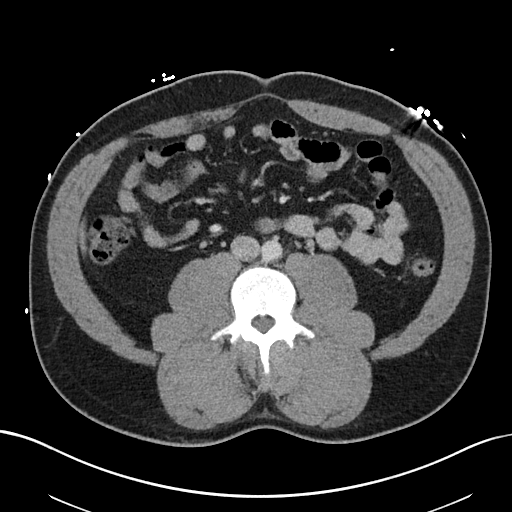
[im 57/92  soft-tissue]
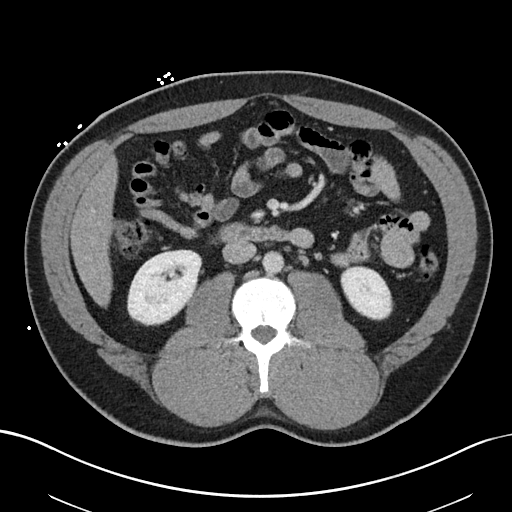
[im 57/92  bone]
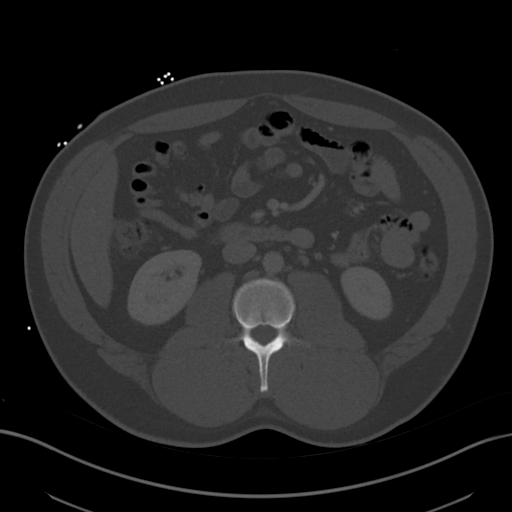
[im 60/92  soft-tissue]
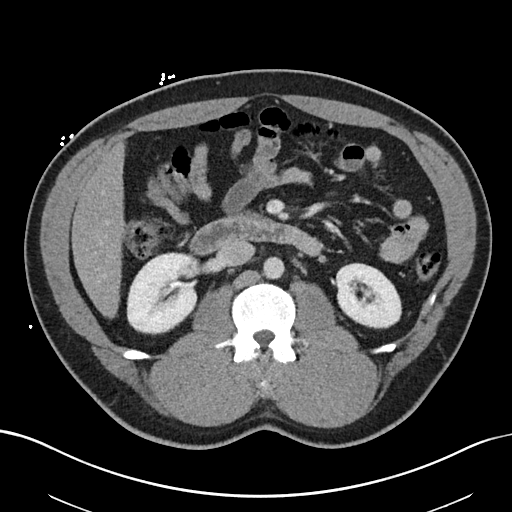
[im 67/92  soft-tissue]
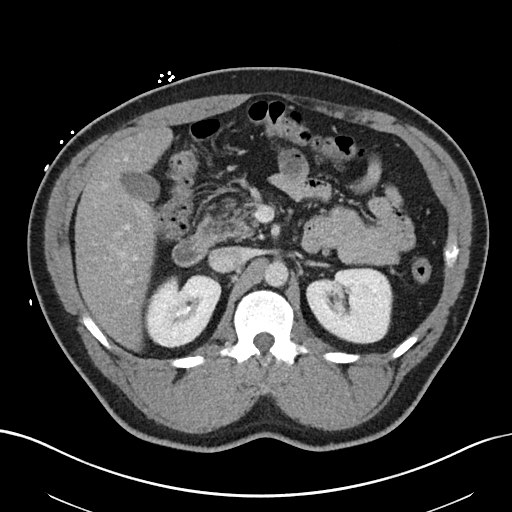
[im 74/92  soft-tissue]
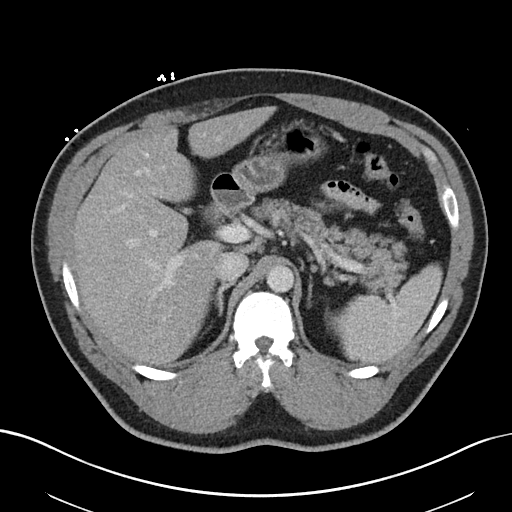
[im 81/92  soft-tissue]
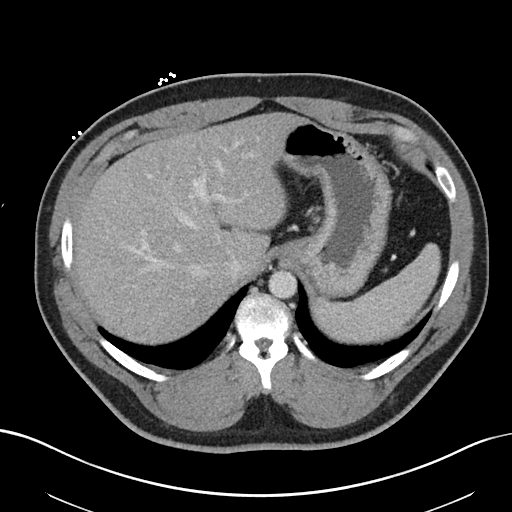
[im 88/92  soft-tissue]
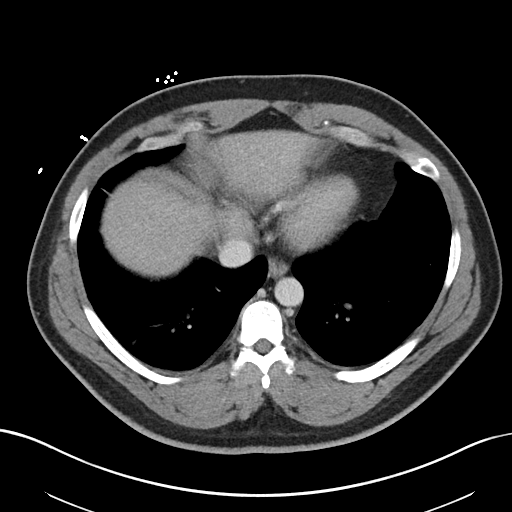

[Series 6: coronal soft tissue · coronal · 0.81mm/px · 3 of 101 slices shown]
[im 34/101  soft-tissue]
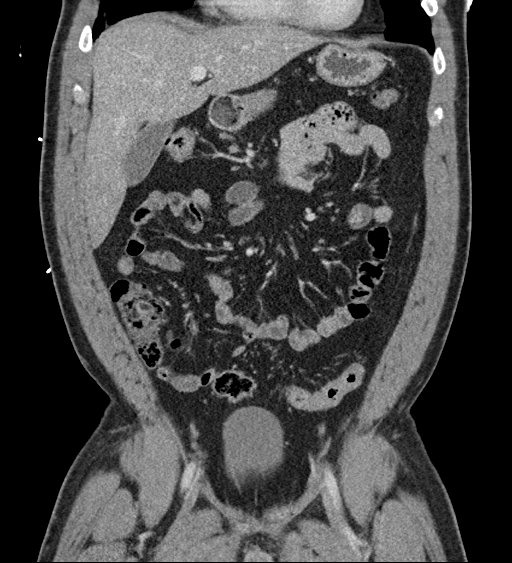
[im 45/101  soft-tissue]
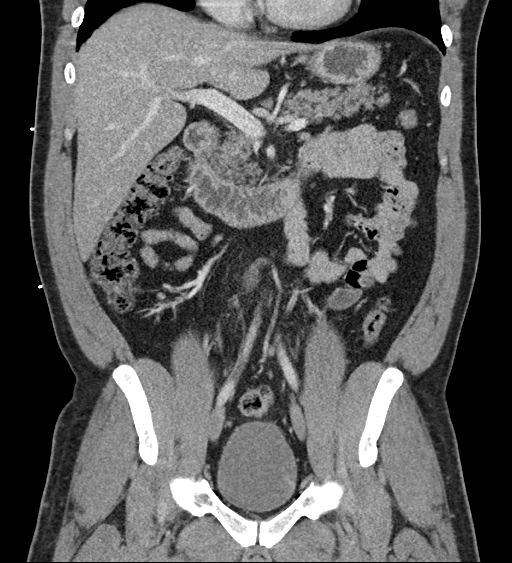
[im 56/101  soft-tissue]
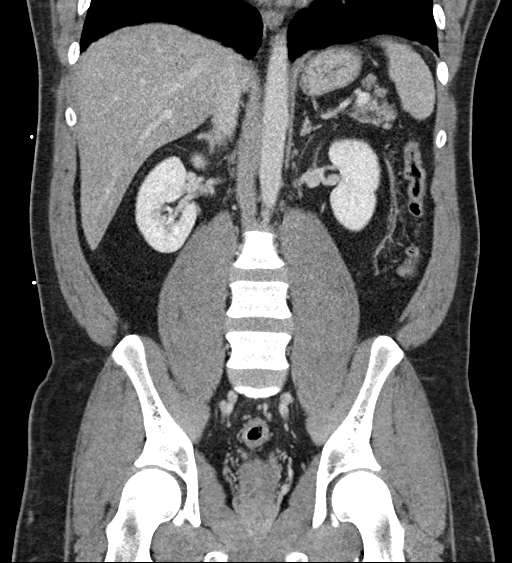

[17 of 46 positions shown; findings below may reference images not displayed]

FINDINGS: Lower chest:  No contributory findings.

Hepatobiliary: Low-density liver with sparing at the gallbladder
fossa. Negative for mass.No evidence of biliary obstruction or
stone.

Pancreas: Unremarkable.

Spleen: Unremarkable.

Adrenals/Urinary Tract: Negative adrenals. No hydronephrosis or
stone. Early excretion of contrast seen in the Upper collecting
system and to a small degree in the urinary bladder. Unremarkable
bladder.

Stomach/Bowel:  No obstruction. No appendicitis.

Vascular/Lymphatic: No acute vascular abnormality. Prominent lymph
nodes along the greater curvature of the stomach but the stomach
itself is unremarkable.

Reproductive:No pathologic findings.

Other: No ascites or pneumoperitoneum.

Musculoskeletal: No acute abnormalities.
IMPRESSION: No acute finding.

Hepatic steatosis.
